# Patient Record
Sex: Male | Born: 1970 | Race: White | Hispanic: No | Marital: Married | Smoking: Never smoker
Health system: Southern US, Community
[De-identification: ages and names within clinical notes are randomized; demographics above are authoritative.]

## PROBLEM LIST (undated history)

## (undated) DIAGNOSIS — IMO0002 Reserved for concepts with insufficient information to code with codable children: Secondary | ICD-10-CM

## (undated) DIAGNOSIS — E0590 Thyrotoxicosis, unspecified without thyrotoxic crisis or storm: Secondary | ICD-10-CM

## (undated) HISTORY — DX: Thyrotoxicosis, unspecified without thyrotoxic crisis or storm: E05.90

## (undated) HISTORY — DX: Reserved for concepts with insufficient information to code with codable children: IMO0002

---

## 2005-07-13 ENCOUNTER — Ambulatory Visit: Payer: Self-pay | Admitting: Endocrinology

## 2006-06-04 ENCOUNTER — Ambulatory Visit: Payer: Self-pay | Admitting: Internal Medicine

## 2006-06-27 ENCOUNTER — Ambulatory Visit: Payer: Self-pay

## 2006-06-27 ENCOUNTER — Encounter: Payer: Self-pay | Admitting: Cardiology

## 2008-01-01 HISTORY — PX: VASECTOMY: SHX75

## 2008-02-18 ENCOUNTER — Ambulatory Visit: Payer: Self-pay | Admitting: Endocrinology

## 2008-02-19 LAB — CONVERTED CEMR LAB
ALT: 38 units/L — ABNORMAL HIGH (ref 0–35)
Alkaline Phosphatase: 91 units/L (ref 39–117)
BUN: 16 mg/dL (ref 6–23)
Basophils Absolute: 0 10*3/uL (ref 0.0–0.1)
Basophils Relative: 0.1 % (ref 0.0–1.0)
Bilirubin, Direct: 0.3 mg/dL (ref 0.0–0.3)
Calcium: 10 mg/dL (ref 8.4–10.5)
Eosinophils Absolute: 0.1 10*3/uL (ref 0.0–0.6)
GFR calc Af Amer: 60 mL/min
GFR calc non Af Amer: 49 mL/min
Lymphocytes Relative: 29.3 % (ref 12.0–46.0)
MCHC: 33.4 g/dL (ref 30.0–36.0)
MCV: 87.1 fL (ref 78.0–100.0)
Monocytes Relative: 12.5 % — ABNORMAL HIGH (ref 3.0–11.0)
Neutro Abs: 2.6 10*3/uL (ref 1.4–7.7)
Platelets: 194 10*3/uL (ref 150–400)
Potassium: 4.3 meq/L (ref 3.5–5.1)
RBC / HPF: NONE SEEN
Specific Gravity, Urine: >=1.03 (ref 1.000–1.03)
TSH: 0.04 microintl units/mL — ABNORMAL LOW (ref 0.35–5.50)
Total Bilirubin: 1.8 mg/dL — ABNORMAL HIGH (ref 0.3–1.2)
Total CHOL/HDL Ratio: 2.3
Total Protein: 6.5 g/dL (ref 6.0–8.3)
Triglycerides: 41 mg/dL (ref 0–149)
Urine Glucose: NEGATIVE mg/dL
VLDL: 8 mg/dL (ref 0–40)

## 2008-02-23 ENCOUNTER — Ambulatory Visit: Payer: Self-pay | Admitting: Endocrinology

## 2008-02-23 DIAGNOSIS — E0590 Thyrotoxicosis, unspecified without thyrotoxic crisis or storm: Secondary | ICD-10-CM | POA: Insufficient documentation

## 2008-02-23 HISTORY — DX: Thyrotoxicosis, unspecified without thyrotoxic crisis or storm: E05.90

## 2008-03-02 ENCOUNTER — Encounter: Admission: RE | Admit: 2008-03-02 | Discharge: 2008-03-02 | Payer: Self-pay | Admitting: Endocrinology

## 2008-03-08 ENCOUNTER — Ambulatory Visit: Payer: Self-pay | Admitting: Endocrinology

## 2008-03-24 ENCOUNTER — Telehealth (INDEPENDENT_AMBULATORY_CARE_PROVIDER_SITE_OTHER): Payer: Self-pay | Admitting: *Deleted

## 2008-04-26 ENCOUNTER — Ambulatory Visit: Payer: Self-pay | Admitting: Endocrinology

## 2008-04-26 DIAGNOSIS — IMO0002 Reserved for concepts with insufficient information to code with codable children: Secondary | ICD-10-CM

## 2008-04-26 HISTORY — DX: Reserved for concepts with insufficient information to code with codable children: IMO0002

## 2008-06-07 ENCOUNTER — Ambulatory Visit: Payer: Self-pay | Admitting: Endocrinology

## 2008-06-07 LAB — CONVERTED CEMR LAB: TSH: 2.22 microintl units/mL (ref 0.35–5.50)

## 2008-09-14 ENCOUNTER — Ambulatory Visit: Payer: Self-pay | Admitting: Endocrinology

## 2008-09-14 DIAGNOSIS — L723 Sebaceous cyst: Secondary | ICD-10-CM | POA: Insufficient documentation

## 2008-09-20 ENCOUNTER — Telehealth (INDEPENDENT_AMBULATORY_CARE_PROVIDER_SITE_OTHER): Payer: Self-pay | Admitting: *Deleted

## 2008-09-22 ENCOUNTER — Encounter: Payer: Self-pay | Admitting: Endocrinology

## 2008-12-15 ENCOUNTER — Ambulatory Visit: Payer: Self-pay | Admitting: Endocrinology

## 2009-02-10 ENCOUNTER — Ambulatory Visit: Payer: Self-pay | Admitting: Endocrinology

## 2009-02-10 LAB — CONVERTED CEMR LAB
Basophils Relative: 0.2 % (ref 0.0–3.0)
Eosinophils Absolute: 0.1 10*3/uL (ref 0.0–0.7)
Eosinophils Relative: 1.1 % (ref 0.0–5.0)
Hemoglobin: 16.2 g/dL (ref 13.0–17.0)
MCV: 91.7 fL (ref 78.0–100.0)
Neutrophils Relative %: 59.8 % (ref 43.0–77.0)
RBC: 5.33 M/uL (ref 4.22–5.81)
WBC: 5.2 10*3/uL (ref 4.5–10.5)

## 2009-07-15 ENCOUNTER — Ambulatory Visit: Payer: Self-pay | Admitting: Internal Medicine

## 2009-07-15 DIAGNOSIS — R079 Chest pain, unspecified: Secondary | ICD-10-CM | POA: Insufficient documentation

## 2009-07-15 DIAGNOSIS — M549 Dorsalgia, unspecified: Secondary | ICD-10-CM | POA: Insufficient documentation

## 2009-10-05 ENCOUNTER — Ambulatory Visit: Payer: Self-pay | Admitting: Endocrinology

## 2010-03-24 ENCOUNTER — Ambulatory Visit: Payer: Self-pay | Admitting: Endocrinology

## 2010-03-24 DIAGNOSIS — R319 Hematuria, unspecified: Secondary | ICD-10-CM | POA: Insufficient documentation

## 2010-03-24 DIAGNOSIS — R310 Gross hematuria: Secondary | ICD-10-CM | POA: Insufficient documentation

## 2010-03-24 LAB — CONVERTED CEMR LAB
Basophils Relative: 0.5 % (ref 0.0–3.0)
Bilirubin Urine: NEGATIVE
Glucose, Urine, Semiquant: NEGATIVE
Hemoglobin: 14.7 g/dL (ref 13.0–17.0)
Lymphocytes Relative: 26.3 % (ref 12.0–46.0)
Monocytes Relative: 7.2 % (ref 3.0–12.0)
Neutro Abs: 4.2 10*3/uL (ref 1.4–7.7)
RBC: 4.76 M/uL (ref 4.22–5.81)
TSH: 0.59 microintl units/mL (ref 0.35–5.50)
WBC Urine, dipstick: NEGATIVE
pH: 5

## 2010-04-04 ENCOUNTER — Encounter: Payer: Self-pay | Admitting: Endocrinology

## 2010-04-13 ENCOUNTER — Encounter: Payer: Self-pay | Admitting: Endocrinology

## 2010-06-16 ENCOUNTER — Telehealth: Payer: Self-pay | Admitting: Endocrinology

## 2011-01-30 NOTE — Progress Notes (Signed)
Summary: tapazole  Phone Note Refill Request Message from:  Fax from Pharmacy on June 16, 2010 8:46 AM  Refills Requested: Medication #1:  TAPAZOLE 5 MG TABS TAKE 1 by mouth three times a WEEK (M   Last Refilled: 02/08/2010 Initial call taken by: Orlan Leavens,  June 16, 2010 8:47 AM    Prescriptions: TAPAZOLE 5 MG TABS (METHIMAZOLE) TAKE 1 by mouth three times a WEEK (M,W,F)  #50 x 3   Entered by:   Orlan Leavens   Authorized by:   Minus Breeding MD   Signed by:   Orlan Leavens on 06/16/2010   Method used:   Electronically to        [facility] [phone]* (retail)       [address]       [place], [place]  [address]       Ph: [phone]       Fax: [phone]   RxID:   [id number]

## 2011-01-30 NOTE — Letter (Signed)
[facility]: [facility]   Imported By: Sherian Rein 04/27/2010 10:28:00  _____________________________________________________________________  External Attachment:    Type:   Image     Comment:   External Document

## 2011-01-30 NOTE — Assessment & Plan Note (Signed)
Summary: blood in urine/cd   Vital Signs:  Patient profile:   41 year old male Height:      71 inches (180.34 cm) Weight:      228 pounds (103.64 kg) O2 Sat:      98 % on Room air Temp:     96.9 degrees F (36.06 degrees C) oral Pulse rate:   58 / minute BP sitting:   118 / 68  (left arm) Cuff size:   regular  Vitals Entered By: Josph Macho RMA (March 24, 2010 10:54 AM) Taken by Sydell Axon  O2 Flow:  Room air CC: pt states there is blood in his urine.//[place]   CC:  pt states there is blood in his urine.//[place].  History of Present Illness: yesterday, pt noted slight amount bright red blood from the urethra, in the context of running 6 miles.  he had urinary frequency x a few hrs.  it recurred this am.  there is little or no associated pain.   he atakes and tolerates the tapazole well  Current Medications (verified): 1)  Tapazole 5 Mg Tabs (Methimazole) .... Take 1 By Mouth Three Times A Week (M,w,f)  Allergies (verified): 1)  ! 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         no bleeding elsewhere in the body  Physical Exam  General:  normal appearance.   Neck:  Supple without thyroid enlargement or tenderness.  Genitalia:  Normal external male genitalia with no urethral discharge. no blood is seen. Additional Exam:  FastTSH                   0.59 uIU/mL                 0.35-5.50    White Cell Count          6.5 K/uL                    4.5-10.5     Hemoglobin                14.7 g/dL                   16.1-09.6   Hematocrit                44.5 %                      39.0-52.0    Platelet Count            191.0 K/uL    Impression & Recommendations:  Problem # 1:  HYPERTHYROIDISM (ICD-242.90) well-controlled  Problem # 2:  GROSS  HEMATURIA (ICD-599.71) Assessment: New prob due to running  Other Orders: Urology Referral (Urology) TLB-TSH (Thyroid Stimulating Hormone) (84443-TSH) TLB-CBC Platelet - w/Differential (85025-CBCD) Est. Patient Level IV (04540)  Patient Instructions: 1)  tests are being ordered for you today.  a few days after the test(s), please call [phone] to hear your test results. 2)  pending the test results, please continue the same medications for now 3)  refer urology. you will be called with a day and time for an appointment 4)  return 6 months. 5)  (update: i left message on phone-tree:  same methimazole)  Laboratory Results   Urine Tests  Routine Urinalysis   Color: yellow Appearance: Clear Glucose: negative   (Normal Range: Negative) Bilirubin: negative   (Normal Range: Negative) Ketone: trace (5)   (Normal Range: Negative) Spec. Gravity: 1.020   (Normal Range: 1.003-1.035) Blood: large   (Normal Range: Negative) pH: 5.0   (Normal Range: 5.0-8.0) Protein: trace   (Normal Range: Negative) Urobilinogen: 0.2   (Normal Range: 0-1) Nitrite: negative   (Normal Range: Negative) Leukocyte Esterace: negative   (Normal Range: Negative)

## 2011-01-30 NOTE — Consult Note (Signed)
Summary: [facility]   Imported By: Lanelle Bal 04/18/2010 10:06:07  _____________________________________________________________________  External Attachment:    Type:   Image     Comment:   External Document

## 2011-06-15 ENCOUNTER — Other Ambulatory Visit: Payer: Self-pay | Admitting: Endocrinology

## 2011-07-19 ENCOUNTER — Encounter: Payer: Self-pay | Admitting: *Deleted

## 2011-07-19 NOTE — Telephone Encounter (Signed)
A user error has taken place: encounter opened in error, closed for administrative reasons.

## 2011-07-23 ENCOUNTER — Other Ambulatory Visit: Payer: Self-pay

## 2011-07-23 MED ORDER — METHIMAZOLE 5 MG PO TABS: ORAL_TABLET | ORAL | Status: DC

## 2011-08-02 ENCOUNTER — Ambulatory Visit (INDEPENDENT_AMBULATORY_CARE_PROVIDER_SITE_OTHER): Payer: BC Managed Care – PPO | Admitting: Endocrinology

## 2011-08-02 ENCOUNTER — Other Ambulatory Visit (INDEPENDENT_AMBULATORY_CARE_PROVIDER_SITE_OTHER): Payer: BC Managed Care – PPO

## 2011-08-02 ENCOUNTER — Encounter: Payer: Self-pay | Admitting: Endocrinology

## 2011-08-02 VITALS — BP 118/80 | HR 64 | Temp 98.0°F | Ht 73.0 in | Wt 220.0 lb

## 2011-08-02 DIAGNOSIS — E0590 Thyrotoxicosis, unspecified without thyrotoxic crisis or storm: Secondary | ICD-10-CM

## 2011-08-02 MED ORDER — CYCLOBENZAPRINE HCL 10 MG PO TABS: 10.0000 mg | ORAL_TABLET | Freq: Three times a day (TID) | ORAL | Status: AC | PRN

## 2011-08-02 NOTE — Progress Notes (Signed)
  Subjective:    Patient ID: Harry Mathews, male    DOB: 06/21/1970, 41 y.o.   MRN: [id number]  HPI pt states he feels well in general.  He takes tapazole as rx'ed.   He continues to have 2 years of slight "spasms" at the right upper back, in the context of running.  No assoc numbness. Past Medical History  Diagnosis Date  . HYPERTHYROIDISM 02/23/2008  . UNSPECIFIED DISORDER OF URETHRA&URINARY TRACT 04/26/2008    Past Surgical History  Procedure Date  . Vasectomy 2009    History   Social History  . Marital Status: Married    Spouse Name: N/A    Number of Children: 3  . Years of Education: N/A   Occupational History  . Construction    Social History Main Topics  . Smoking status: Never Smoker   . Smokeless tobacco: Not on file  . Alcohol Use: Yes     social  . Drug Use:   . Sexually Active:    Other Topics Concern  . Not on file   Social History Narrative  . No narrative on file    Current Outpatient Prescriptions on File Prior to Visit  Medication Sig Dispense Refill  . methimazole (TAPAZOLE) 5 MG tablet Take on tablet by mouth three times weekly (Monday, Wednesday and Friday)  50 tablet  0    Allergies  Allergen Reactions  . Penicillins     Family History  Problem Relation Age of Onset  . COPD Neg Hx     BP 118/80  Pulse 64  Temp(Src) 98 F (36.7 C) (Oral)  Ht 6\' 1"  (1.854 m)  Wt 220 lb (99.791 kg)  BMI 29.03 kg/m2  SpO2 97%  Review of Systems Denies fever.  He has lost a few lbs.    Objective:   Physical Exam GENERAL: no distress Neck:  No goiter or nodule.    Lab Results  Component Value Date   TSH 0.03* 08/03/2011   Assessment & Plan:  Hyperthyroidism, needs increased rx Back sxs, possibly thyroid-related

## 2011-08-02 NOTE — Patient Instructions (Addendum)
blood tests are being ordered for you today.  please call [phone] to hear your test results.  You will be prompted to enter the 9-digit "MRN" number that appears at the top left of this page, followed by #.  Then you will hear the message. Please make a regular physical appointment in 6 months. (update: i left message on phone-tree:  Increase to 5 mg qd.  Go to lab in 3 mos for tsh and free t4.  if ever you have fever while taking this medication, stop it and call us, because of the risk of a rare side-effect)

## 2011-08-03 ENCOUNTER — Other Ambulatory Visit (INDEPENDENT_AMBULATORY_CARE_PROVIDER_SITE_OTHER): Payer: BC Managed Care – PPO

## 2011-08-03 DIAGNOSIS — E0590 Thyrotoxicosis, unspecified without thyrotoxic crisis or storm: Secondary | ICD-10-CM

## 2011-08-03 MED ORDER — METHIMAZOLE 5 MG PO TABS: 5.0000 mg | ORAL_TABLET | Freq: Every day | ORAL | Status: DC

## 2011-09-12 ENCOUNTER — Ambulatory Visit (INDEPENDENT_AMBULATORY_CARE_PROVIDER_SITE_OTHER): Payer: BC Managed Care – PPO | Admitting: Endocrinology

## 2011-09-12 ENCOUNTER — Encounter: Payer: Self-pay | Admitting: Endocrinology

## 2011-09-12 ENCOUNTER — Other Ambulatory Visit (INDEPENDENT_AMBULATORY_CARE_PROVIDER_SITE_OTHER): Payer: BC Managed Care – PPO

## 2011-09-12 ENCOUNTER — Ambulatory Visit (INDEPENDENT_AMBULATORY_CARE_PROVIDER_SITE_OTHER)
Admission: RE | Admit: 2011-09-12 | Discharge: 2011-09-12 | Disposition: A | Discharge status: Home / Self Care | Payer: BC Managed Care – PPO | Source: Ambulatory Visit | Attending: Endocrinology | Admitting: Endocrinology

## 2011-09-12 DIAGNOSIS — R05 Cough: Secondary | ICD-10-CM

## 2011-09-12 DIAGNOSIS — Z79899 Other long term (current) drug therapy: Secondary | ICD-10-CM

## 2011-09-12 DIAGNOSIS — E0590 Thyrotoxicosis, unspecified without thyrotoxic crisis or storm: Secondary | ICD-10-CM

## 2011-09-12 DIAGNOSIS — R059 Cough, unspecified: Secondary | ICD-10-CM

## 2011-09-12 DIAGNOSIS — R229 Localized swelling, mass and lump, unspecified: Secondary | ICD-10-CM

## 2011-09-12 LAB — CBC WITH DIFFERENTIAL/PLATELET
Basophils Relative: 0.1 % (ref 0.0–3.0)
Eosinophils Relative: 1.2 % (ref 0.0–5.0)
HCT: 51.1 % (ref 39.0–52.0)
Lymphs Abs: 1.1 10*3/uL (ref 0.7–4.0)
MCV: 91.4 fl (ref 78.0–100.0)
Monocytes Absolute: 0.6 10*3/uL (ref 0.1–1.0)
Neutrophils Relative %: 81.3 % — ABNORMAL HIGH (ref 43.0–77.0)
RBC: 5.59 Mil/uL (ref 4.22–5.81)
WBC: 9.7 10*3/uL (ref 4.5–10.5)

## 2011-09-12 LAB — T4, FREE: Free T4: 1.28 ng/dL (ref 0.60–1.60)

## 2011-09-12 MED ORDER — DOXYCYCLINE HYCLATE 100 MG PO TABS: 100.0000 mg | ORAL_TABLET | Freq: Two times a day (BID) | ORAL | Status: AC

## 2011-09-12 MED ORDER — METHIMAZOLE 10 MG PO TABS: 10.0000 mg | ORAL_TABLET | Freq: Three times a day (TID) | ORAL | Status: DC

## 2011-09-12 MED ORDER — PROMETHAZINE-CODEINE 6.25-10 MG/5ML PO SYRP: 5.0000 mL | ORAL_SOLUTION | ORAL | Status: AC | PRN

## 2011-09-12 NOTE — Patient Instructions (Addendum)
blood tests, and a chest-x-ray. are being requested for you today.  please call [phone] to hear your test results.  You will be prompted to enter the 9-digit "MRN" number that appears at the top left of this page, followed by #.  Then you will hear the message. here is a sample of "advair-100."  take 1 puff 2x a day.  rinse mouth after using. i have sent a prescription to your pharmacy for an antibiotic. Here is a presciption for cough syrup.   I hope you feel better soon.  If you don't feel better by next week, please call back. Refer to a dermatologist.  you will receive a phone call, about a day and time for an appointment (update: i left message on phone-tree:  Increase tapazole to 10/d.  Ret 2 mos)

## 2011-09-12 NOTE — Progress Notes (Signed)
  Subjective:    Patient ID: Harry Mathews, male    DOB: [birth year], 41 y.o.   MRN: [id number]  HPI Pt states 10 days of moderate dry-quality cough, and assoc pain at the chest.  The cough has become productive within the last day.  Denies sore throat and earache. He feels no different since the tapazole was increased. Pt states few years of moderate # of skin nodules on the hands (left >> right), but no assoc pain or itching. Past Medical History  Diagnosis Date  . HYPERTHYROIDISM 02/23/2008  . UNSPECIFIED DISORDER OF URETHRA&URINARY TRACT 04/26/2008    Past Surgical History  Procedure Date  . Vasectomy 2009    History   Social History  . Marital Status: Married    Spouse Name: N/A    Number of Children: 3  . Years of Education: N/A   Occupational History  . Construction    Social History Main Topics  . Smoking status: Never Smoker   . Smokeless tobacco: Not on file  . Alcohol Use: Yes     social  . Drug Use:   . Sexually Active:    Other Topics Concern  . Not on file   Social History Narrative  . No narrative on file    No current outpatient prescriptions on file prior to visit.    Allergies  Allergen Reactions  . Penicillins     Family History  Problem Relation Age of Onset  . COPD Neg Hx     BP 124/76  Pulse 73  Temp(Src) 97.9 F (36.6 C) (Oral)  Ht 6\' 1"  (1.854 m)  Wt 218 lb (98.884 kg)  BMI 28.76 kg/m2  SpO2 98%    Review of Systems Denies fever, but he has diaphoresis.    Objective:   Physical Exam VITAL SIGNS:  See vs page GENERAL: no distress head: no deformity eyes: no periorbital swelling, no proptosis external nose and ears are normal mouth: no lesion seen Ears: left tm is red, but right is normal LUNGS:  Clear to auscultation, except for a few exp wheezes. Skin: multiple flat nodules on the extensor aspect of the hands, more on the left.    Assessment & Plan:  Skin nodules, new.  uncertain etiology [place]ford Peers,  new Hyperthyroidism, needs increased rx

## 2011-09-13 ENCOUNTER — Ambulatory Visit: Payer: BC Managed Care – PPO | Admitting: Endocrinology

## 2011-10-08 ENCOUNTER — Telehealth: Payer: Self-pay | Admitting: *Deleted

## 2011-10-08 DIAGNOSIS — R229 Localized swelling, mass and lump, unspecified: Secondary | ICD-10-CM

## 2011-10-08 NOTE — Telephone Encounter (Signed)
done

## 2011-10-08 NOTE — Telephone Encounter (Signed)
Pt is requesting a referral to another dermatologist (does not want to see Dr. Sharyn Lull).

## 2011-10-09 NOTE — Telephone Encounter (Signed)
Pt informed via VM, left message for pt to callback office with any questions/concerns.  

## 2012-02-20 ENCOUNTER — Encounter: Payer: Self-pay | Admitting: Endocrinology

## 2012-02-20 ENCOUNTER — Ambulatory Visit (INDEPENDENT_AMBULATORY_CARE_PROVIDER_SITE_OTHER): Payer: BC Managed Care – PPO | Admitting: Endocrinology

## 2012-02-20 ENCOUNTER — Other Ambulatory Visit (INDEPENDENT_AMBULATORY_CARE_PROVIDER_SITE_OTHER): Payer: BC Managed Care – PPO

## 2012-02-20 DIAGNOSIS — Z79899 Other long term (current) drug therapy: Secondary | ICD-10-CM

## 2012-02-20 DIAGNOSIS — Z0000 Encounter for general adult medical examination without abnormal findings: Secondary | ICD-10-CM

## 2012-02-20 DIAGNOSIS — E0590 Thyrotoxicosis, unspecified without thyrotoxic crisis or storm: Secondary | ICD-10-CM

## 2012-02-20 LAB — LIPID PANEL
Cholesterol: 110 mg/dL (ref 0–200)
HDL: 48.4 mg/dL (ref >39.00)
LDL Cholesterol: 56 mg/dL (ref 0–99)
Total CHOL/HDL Ratio: 2
Triglycerides: 30 mg/dL (ref 0.0–149.0)
VLDL: 6 mg/dL (ref 0.0–40.0)

## 2012-02-20 LAB — BASIC METABOLIC PANEL
CO2: 26 mEq/L (ref 19–32)
Chloride: 106 mEq/L (ref 96–112)
Potassium: 4.1 mEq/L (ref 3.5–5.1)
Sodium: 141 mEq/L (ref 135–145)

## 2012-02-20 LAB — URINALYSIS, ROUTINE W REFLEX MICROSCOPIC
Bilirubin Urine: NEGATIVE
Specific Gravity, Urine: >=1.03 (ref 1.000–1.030)
Urine Glucose: NEGATIVE
Urobilinogen, UA: 1 (ref 0.0–1.0)

## 2012-02-20 LAB — CBC WITH DIFFERENTIAL/PLATELET
Basophils Absolute: 0 10*3/uL (ref 0.0–0.1)
Eosinophils Relative: 3.4 % (ref 0.0–5.0)
Hemoglobin: 15.9 g/dL (ref 13.0–17.0)
Lymphocytes Relative: 23.6 % (ref 12.0–46.0)
Monocytes Relative: 11.8 % (ref 3.0–12.0)
Neutro Abs: 4.5 10*3/uL (ref 1.4–7.7)
RDW: 12.8 % (ref 11.5–14.6)
WBC: 7.5 10*3/uL (ref 4.5–10.5)

## 2012-02-20 LAB — T4, FREE: Free T4: 0.97 ng/dL (ref 0.60–1.60)

## 2012-02-20 LAB — HEPATIC FUNCTION PANEL
AST: 34 U/L (ref 0–37)
Albumin: 4.4 g/dL (ref 3.5–5.2)
Alkaline Phosphatase: 97 U/L (ref 39–117)
Total Bilirubin: 1.2 mg/dL (ref 0.3–1.2)

## 2012-02-20 MED ORDER — METHIMAZOLE 10 MG PO TABS: 10.0000 mg | ORAL_TABLET | Freq: Every day | ORAL | Status: DC

## 2012-02-20 NOTE — Progress Notes (Signed)
Subjective:    Patient ID: Harry Mathews, male    DOB: 21-Mar-1970, 42 y.o.   MRN: [id number]  HPI here for regular wellness examination.  He's feeling pretty well in general, and says chronic med probs are stable, except as noted below.   Past Medical History  Diagnosis Date  . HYPERTHYROIDISM 02/23/2008  . UNSPECIFIED DISORDER OF URETHRA&URINARY TRACT 04/26/2008    Past Surgical History  Procedure Date  . Vasectomy 2009    History   Social History  . Marital Status: Married    Spouse Name: N/A    Number of Children: 3  . Years of Education: N/A   Occupational History  . Construction    Social History Main Topics  . Smoking status: Never Smoker   . Smokeless tobacco: Not on file  . Alcohol Use: Yes     social  . Drug Use:   . Sexually Active:    Other Topics Concern  . Not on file   Social History Narrative  . No narrative on file    No current outpatient prescriptions on file prior to visit.    Allergies  Allergen Reactions  . Penicillins     Family History  Problem Relation Age of Onset  . COPD Neg Hx     BP 130/90  Pulse 62  Temp(Src) 96.9 F (36.1 C) (Oral)  Ht 6\' 1"  (1.854 m)  Wt 224 lb 4.8 oz (101.742 kg)  BMI 29.59 kg/m2  SpO2 95%    Review of Systems  Constitutional: Negative for unexpected weight change.  HENT: Negative for tinnitus.   Eyes: Negative for visual disturbance.  Respiratory: Negative for shortness of breath.   Cardiovascular: Negative for chest pain.  Gastrointestinal: Negative for anal bleeding.  Genitourinary: Negative for hematuria and difficulty urinating.  Musculoskeletal: Negative for back pain.  Skin: Negative for rash.  Neurological: Negative for numbness and headaches.  Hematological: Does not bruise/bleed easily.  Psychiatric/Behavioral: Negative for dysphoric mood.       Objective:   Physical Exam VS: see vs page GEN: no distress HEAD: head: no deformity eyes: no periorbital swelling, no  proptosis external nose and ears are normal mouth: no lesion seen NECK: supple, thyroid is not enlarged CHEST WALL: no deformity LUNGS: clear to auscultation BREASTS:  pseudogynecomastia CV: reg rate and rhythm, no murmur ABD: abdomen is soft, nontender.  no hepatosplenomegaly.  not distended.  no hernia GENITALIA:  Normal male.   MUSCULOSKELETAL: muscle bulk and strength are grossly normal.  no obvious joint swelling.  gait is normal and steady EXTEMITIES: no deformity.  no ulcer on the feet.  feet are of normal color and temp.  no edema PULSES: dorsalis pedis intact bilat.  no carotid bruit NEURO:  cn 2-12 grossly intact.   readily moves all 4's.  sensation is intact to touch on the feet SKIN:  Normal texture and temperature.  No rash or suspicious lesion is visible.  Ecchymosis at RLQ of abdomen is from recent mild basketball injury NODES:  None palpable at the neck PSYCH: alert, oriented x3.  Does not appear anxious nor depressed.       Assessment & Plan:  Wellness visit today, with problems stable, except as noted.   SEPARATE EVALUATION FOLLOWS--EACH PROBLEM HERE IS NEW, NOT RESPONDING TO TREATMENT, OR POSES SIGNIFICANT RISK TO THE PATIENT'S HEALTH: HISTORY OF THE PRESENT ILLNESS: Pt states few years of slight healing loss from both ears, but no assoc otalgia. He takes tapazole only 5  mg qd.   PAST MEDICAL HISTORY reviewed and up to date today REVIEW OF SYSTEMS: Denies fever and syncope PHYSICAL EXAMINATION: VITAL SIGNS:  See vs page GENERAL: no distress Both eac's and tm's are normal. LAB/XRAY RESULTS: Lab Results  Component Value Date   TSH 0.11* 02/20/2012  IMPRESSION: Hearing loss, new Hyperthyroidism, needs increased rx PLAN: See instruction page

## 2012-02-20 NOTE — Patient Instructions (Addendum)
please consider these measures for your health:  minimize alcohol.  do not use tobacco products.  keep firearms safely stored.  always use seat belts.  have working smoke alarms in your home.  see an eye doctor and dentist regularly.  never drive under the influence of alcohol or drugs (including prescription drugs).  those with fair skin should take precautions against the sun. blood tests are being requested for you today.  please call [phone] to hear your test results.  You will be prompted to enter the 9-digit "MRN" number that appears at the top left of this page, followed by #.  Then you will hear the message. Please come back for a follow-up appointment in 6 months. Call if you want to see a hearing specialist.   (update: i left message on phone-tree:  Increase tapazole to 10/d)

## 2012-11-17 ENCOUNTER — Encounter: Payer: Self-pay | Admitting: Endocrinology

## 2012-11-17 ENCOUNTER — Ambulatory Visit (INDEPENDENT_AMBULATORY_CARE_PROVIDER_SITE_OTHER): Payer: BC Managed Care – PPO | Admitting: Endocrinology

## 2012-11-17 VITALS — BP 130/78 | HR 71 | Temp 97.8°F | Wt 228.0 lb

## 2012-11-17 DIAGNOSIS — E0590 Thyrotoxicosis, unspecified without thyrotoxic crisis or storm: Secondary | ICD-10-CM

## 2012-11-17 DIAGNOSIS — Z23 Encounter for immunization: Secondary | ICD-10-CM

## 2012-11-17 MED ORDER — PROMETHAZINE-CODEINE 6.25-10 MG/5ML PO SYRP: 5.0000 mL | ORAL_SOLUTION | ORAL | Status: AC | PRN

## 2012-11-17 MED ORDER — AZITHROMYCIN 500 MG PO TABS: 500.0000 mg | ORAL_TABLET | Freq: Every day | ORAL | Status: DC

## 2012-11-17 MED ORDER — ALBUTEROL SULFATE HFA 108 (90 BASE) MCG/ACT IN AERS: 2.0000 | INHALATION_SPRAY | Freq: Four times a day (QID) | RESPIRATORY_TRACT | Status: DC | PRN

## 2012-11-17 NOTE — Patient Instructions (Addendum)
Here are 3 prescriptions: antibiotic, cough syrup, and inhaler.  A thyroid blood test is being requested for you today.  We'll contact you with results.   I hope you feel better soon.  If you don't feel better by next week, please call back.  Please call sooner if you get worse.

## 2012-11-17 NOTE — Progress Notes (Signed)
  Subjective:    Patient ID: Harry Mathews, male    DOB: Mar 26, 1970, 42 y.o.   MRN: [id number]  HPI Pt states 2 weeks of slight prod-quality cough in the chest, and assoc wheezing. 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   Past Medical History  Diagnosis Date  . HYPERTHYROIDISM 02/23/2008  . UNSPECIFIED DISORDER OF URETHRA&URINARY TRACT 04/26/2008    Past Surgical History  Procedure Date  . Vasectomy 2009    History   Social History  . Marital Status: Married    Spouse Name: N/A    Number of Children: 3  . Years of Education: N/A   Occupational History  . Construction    Social History Main Topics  . Smoking status: Never Smoker   . Smokeless tobacco: Not on file  . Alcohol Use: Yes     Comment: social  . Drug Use:   . Sexually Active:    Other Topics Concern  . Not on file   Social History Narrative  . No narrative on file    Current Outpatient Prescriptions on File Prior to Visit  Medication Sig Dispense Refill  . methimazole (TAPAZOLE) 10 MG tablet Take 1 tablet (10 mg total) by mouth daily.  30 tablet  7  . albuterol (PROVENTIL HFA;VENTOLIN HFA) 108 (90 BASE) MCG/ACT inhaler Inhale 2 puffs into the lungs every 6 (six) hours as needed for wheezing.  1 Inhaler  0    Allergies  Allergen Reactions  . Penicillins     Family History  Problem Relation Age of Onset  . COPD Neg Hx     BP 130/78  Pulse 71  Temp 97.8 F (36.6 C) (Oral)  Wt 228 lb (103.42 kg)  SpO2 96%   Review of Systems Denies fever and earache    Objective:   Physical Exam VITAL SIGNS:  See vs page GENERAL: no distress head: no deformity eyes: no periorbital swelling, no proptosis external nose and ears are normal mouth: no lesion seen LUNGS:  Clear to auscultation, except for a few wheezes.    Lab Results  Component Value Date   TSH 2.050 11/17/2012      Assessment & Plan:    URI, new Hyperthyroidism, well-controlled

## 2013-08-28 ENCOUNTER — Other Ambulatory Visit: Payer: Self-pay | Admitting: Endocrinology

## 2013-11-09 ENCOUNTER — Ambulatory Visit (INDEPENDENT_AMBULATORY_CARE_PROVIDER_SITE_OTHER): Payer: BC Managed Care – PPO

## 2013-11-09 DIAGNOSIS — Z23 Encounter for immunization: Secondary | ICD-10-CM

## 2013-12-22 ENCOUNTER — Other Ambulatory Visit: Payer: Self-pay | Admitting: Endocrinology

## 2014-06-10 ENCOUNTER — Other Ambulatory Visit: Payer: Self-pay | Admitting: Endocrinology

## 2014-06-11 NOTE — Telephone Encounter (Signed)
Refill x 1 Ov is due 

## 2014-09-10 ENCOUNTER — Encounter: Payer: Self-pay | Admitting: Endocrinology

## 2014-09-10 ENCOUNTER — Ambulatory Visit (INDEPENDENT_AMBULATORY_CARE_PROVIDER_SITE_OTHER): Payer: BC Managed Care – PPO | Admitting: Endocrinology

## 2014-09-10 VITALS — BP 118/70 | HR 85 | Temp 97.6°F | Ht 73.0 in | Wt 225.0 lb

## 2014-09-10 DIAGNOSIS — E0590 Thyrotoxicosis, unspecified without thyrotoxic crisis or storm: Secondary | ICD-10-CM | POA: Diagnosis not present

## 2014-09-10 DIAGNOSIS — Z79899 Other long term (current) drug therapy: Secondary | ICD-10-CM

## 2014-09-10 DIAGNOSIS — R319 Hematuria, unspecified: Secondary | ICD-10-CM | POA: Diagnosis not present

## 2014-09-10 DIAGNOSIS — IMO0002 Reserved for concepts with insufficient information to code with codable children: Secondary | ICD-10-CM

## 2014-09-10 DIAGNOSIS — Z0000 Encounter for general adult medical examination without abnormal findings: Secondary | ICD-10-CM

## 2014-09-10 DIAGNOSIS — Z23 Encounter for immunization: Secondary | ICD-10-CM

## 2014-09-10 LAB — URINALYSIS, ROUTINE W REFLEX MICROSCOPIC
Bilirubin Urine: NEGATIVE
LEUKOCYTES UA: NEGATIVE
NITRITE: NEGATIVE
SPECIFIC GRAVITY, URINE: 1.025 (ref 1.000–1.030)
TOTAL PROTEIN, URINE-UPE24: NEGATIVE
Urine Glucose: NEGATIVE
Urobilinogen, UA: 0.2 (ref 0.0–1.0)
WBC, UA: NONE SEEN (ref 0–?)
pH: 5.5 (ref 5.0–8.0)

## 2014-09-10 LAB — LIPID PANEL
CHOL/HDL RATIO: 3
Cholesterol: 90 mg/dL (ref 0–200)
HDL: 34.3 mg/dL — ABNORMAL LOW (ref >39.00)
LDL CALC: 48 mg/dL (ref 0–99)
NonHDL: 55.7
Triglycerides: 38 mg/dL (ref 0.0–149.0)
VLDL: 7.6 mg/dL (ref 0.0–40.0)

## 2014-09-10 LAB — HEPATIC FUNCTION PANEL
ALBUMIN: 3.8 g/dL (ref 3.5–5.2)
ALT: 26 U/L (ref 0–53)
AST: 26 U/L (ref 0–37)
Alkaline Phosphatase: 69 U/L (ref 39–117)
BILIRUBIN TOTAL: 0.7 mg/dL (ref 0.2–1.2)
Bilirubin, Direct: 0.2 mg/dL (ref 0.0–0.3)
Total Protein: 7.1 g/dL (ref 6.0–8.3)

## 2014-09-10 LAB — TSH: TSH: 1.61 u[IU]/mL (ref 0.35–4.50)

## 2014-09-10 LAB — BASIC METABOLIC PANEL
BUN: 18 mg/dL (ref 6–23)
CO2: 25 mEq/L (ref 19–32)
Calcium: 9 mg/dL (ref 8.4–10.5)
Chloride: 107 mEq/L (ref 96–112)
Creatinine, Ser: 1.2 mg/dL (ref 0.4–1.5)
GFR: 71.04 mL/min (ref >60.00)
Glucose, Bld: 85 mg/dL (ref 70–99)
Potassium: 3.7 mEq/L (ref 3.5–5.1)
SODIUM: 140 meq/L (ref 135–145)

## 2014-09-10 MED ORDER — ALBUTEROL SULFATE HFA 108 (90 BASE) MCG/ACT IN AERS: 2.0000 | INHALATION_SPRAY | Freq: Four times a day (QID) | RESPIRATORY_TRACT | Status: DC | PRN

## 2014-09-10 MED ORDER — FLUTICASONE-SALMETEROL 100-50 MCG/DOSE IN AEPB: 1.0000 | INHALATION_SPRAY | Freq: Two times a day (BID) | RESPIRATORY_TRACT | Status: DC

## 2014-09-10 NOTE — Patient Instructions (Addendum)
blood tests are being requested for you today.  We'll contact you with results.  if ever you have fever while taking methimazole, stop it and call us, because of the risk of a rare side-effect i have sent a prescription to your pharmacy, for 2 inhalers (1 is twice a day, until you are better, and the other is as-needed).  I hope you feel better soon.  If you don't feel better in 1 week, please call back.  Please call sooner if you get worse.

## 2014-09-10 NOTE — Progress Notes (Signed)
Subjective:    Patient ID: Harry Mathews, male    DOB: August 13, 1970, 44 y.o.   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   Pt states few weeks of slight wheezing in the chest, and assoc diarrhea.   Past Medical History  Diagnosis Date  . HYPERTHYROIDISM 02/23/2008  . UNSPECIFIED DISORDER OF URETHRA&URINARY TRACT 04/26/2008    Past Surgical History  Procedure Laterality Date  . Vasectomy  2009    History   Social History  . Marital Status: Married    Spouse Name: N/A    Number of Children: 3  . Years of Education: N/A   Occupational History  . Construction    Social History Main Topics  . Smoking status: Never Smoker   . Smokeless tobacco: Not on file  . Alcohol Use: Yes     Comment: social  . Drug Use:   . Sexual Activity:    Other Topics Concern  . Not on file   Social History Narrative  . No narrative on file    Current Outpatient Prescriptions on File Prior to Visit  Medication Sig Dispense Refill  . methimazole (TAPAZOLE) 10 MG tablet TAKE ONE TABLET BY MOUTH THREE TIMES DAILY   90 tablet  0   No current facility-administered medications on file prior to visit.    Allergies  Allergen Reactions  . Penicillins     Family History  Problem Relation Age of Onset  . COPD Neg Hx     BP 118/70  Pulse 85  Temp(Src) 97.6 F (36.4 C) (Oral)  Ht  (1.854 m)  Wt 225 lb (102.059 kg)  BMI 29.69 kg/m2  SpO2 93%   Review of Systems He had fever only once, and that was 6 days ago.  Denies n/v/BRBPR.      Objective:   Physical Exam VS: see vs page GEN: no distress HEAD: head: no deformity eyes: no periorbital swelling, no proptosis external nose and ears are normal mouth: no lesion seen NECK: supple, thyroid is not enlarged.   CHEST WALL: no deformity LUNGS: clear to auscultation.   CV: reg rate and rhythm, no murmur ABD: abdomen is  soft, nontender.  no hepatosplenomegaly.  not distended.  no hernia SKIN:  Normal texture and temperature.  No rash or suspicious lesion is visible.   NODES:  None palpable at the neck PSYCH: alert, well-oriented.  Does not appear anxious nor depressed.   Lab Results  Component Value Date   WBC 7.5 02/20/2012   HGB 15.9 02/20/2012   HCT 47.0 02/20/2012   PLT 193.0 02/20/2012   GLUCOSE 85 09/10/2014   CHOL 90 09/10/2014   TRIG 38.0 09/10/2014   HDL 34.30* 09/10/2014   LDLCALC 48 09/10/2014   ALT 26 09/10/2014   AST 26 09/10/2014   NA 140 09/10/2014   K 3.7 09/10/2014   CL 107 09/10/2014   CREATININE 1.2 09/10/2014   BUN 18 09/10/2014   CO2 25 09/10/2014   TSH 1.61 09/10/2014   HGBA1C 5.4 08/02/2011       Assessment & Plan:  Wheezing, new, prob allergic.  Pt declines spirometry Hyperthyroidism: well-controlled Diarrhea: usually viral.  Pt advised this will most likely get better with time.   Patient is advised the following: Patient Instructions  blood tests are being requested for you today.  We'll contact you with results.  if ever you have fever while taking methimazole, stop it and call us, because of the risk of a rare side-effect i have sent a prescription to your pharmacy, for 2 inhalers (1 is twice a day, until you are better, and the other is as-needed).  I hope you feel better soon.  If you don't feel better in 1 week, please call back.  Please call sooner if you get worse.

## 2014-10-20 ENCOUNTER — Other Ambulatory Visit: Payer: Self-pay | Admitting: Endocrinology

## 2015-01-12 ENCOUNTER — Other Ambulatory Visit: Payer: Self-pay | Admitting: Endocrinology

## 2015-08-09 ENCOUNTER — Other Ambulatory Visit: Payer: Self-pay | Admitting: Endocrinology

## 2015-12-10 ENCOUNTER — Other Ambulatory Visit: Payer: Self-pay | Admitting: Endocrinology

## 2015-12-12 NOTE — Telephone Encounter (Signed)
Please advise if ok to refill last office visit was on 09/10/2014.

## 2015-12-12 NOTE — Telephone Encounter (Signed)
Please refill each x 1 month cpx is due

## 2016-01-12 ENCOUNTER — Other Ambulatory Visit: Payer: Self-pay | Admitting: Endocrinology

## 2016-01-12 NOTE — Telephone Encounter (Signed)
Please advise if okt to refill. Last office visit was 09/10/2014.

## 2016-01-12 NOTE — Telephone Encounter (Signed)
Please refill x 1 Ov is due  

## 2016-05-23 ENCOUNTER — Ambulatory Visit
Admission: RE | Admit: 2016-05-23 | Discharge: 2016-05-23 | Disposition: A | Discharge status: Home / Self Care | Payer: BLUE CROSS/BLUE SHIELD | Source: Ambulatory Visit | Attending: Endocrinology | Admitting: Endocrinology

## 2016-05-23 ENCOUNTER — Encounter: Payer: Self-pay | Admitting: Endocrinology

## 2016-05-23 ENCOUNTER — Ambulatory Visit (INDEPENDENT_AMBULATORY_CARE_PROVIDER_SITE_OTHER): Payer: BLUE CROSS/BLUE SHIELD | Admitting: Endocrinology

## 2016-05-23 VITALS — BP 134/88 | HR 66 | Temp 98.0°F | Wt 239.0 lb

## 2016-05-23 DIAGNOSIS — S9031XA Contusion of right foot, initial encounter: Secondary | ICD-10-CM

## 2016-05-23 DIAGNOSIS — E0590 Thyrotoxicosis, unspecified without thyrotoxic crisis or storm: Secondary | ICD-10-CM | POA: Diagnosis not present

## 2016-05-23 DIAGNOSIS — R310 Gross hematuria: Secondary | ICD-10-CM

## 2016-05-23 DIAGNOSIS — Z0000 Encounter for general adult medical examination without abnormal findings: Secondary | ICD-10-CM | POA: Diagnosis not present

## 2016-05-23 LAB — CBC WITH DIFFERENTIAL/PLATELET
BASOS ABS: 0 10*3/uL (ref 0.0–0.1)
Basophils Relative: 0.3 % (ref 0.0–3.0)
EOS ABS: 0.2 10*3/uL (ref 0.0–0.7)
EOS PCT: 2.1 % (ref 0.0–5.0)
HCT: 46.3 % (ref 39.0–52.0)
HEMOGLOBIN: 15.6 g/dL (ref 13.0–17.0)
Lymphocytes Relative: 13.7 % (ref 12.0–46.0)
Lymphs Abs: 1.2 10*3/uL (ref 0.7–4.0)
MCHC: 33.6 g/dL (ref 30.0–36.0)
MCV: 88.9 fl (ref 78.0–100.0)
MONO ABS: 0.6 10*3/uL (ref 0.1–1.0)
Monocytes Relative: 7 % (ref 3.0–12.0)
NEUTROS PCT: 76.9 % (ref 43.0–77.0)
Neutro Abs: 6.6 10*3/uL (ref 1.4–7.7)
Platelets: 188 10*3/uL (ref 150.0–400.0)
RBC: 5.21 Mil/uL (ref 4.22–5.81)
RDW: 13.1 % (ref 11.5–15.5)
WBC: 8.6 10*3/uL (ref 4.0–10.5)

## 2016-05-23 LAB — BASIC METABOLIC PANEL
BUN: 17 mg/dL (ref 6–23)
CALCIUM: 9.6 mg/dL (ref 8.4–10.5)
CO2: 30 meq/L (ref 19–32)
Chloride: 106 mEq/L (ref 96–112)
Creatinine, Ser: 1.17 mg/dL (ref 0.40–1.50)
GFR: 71.87 mL/min (ref >60.00)
GLUCOSE: 106 mg/dL — AB (ref 70–99)
Potassium: 3.6 mEq/L (ref 3.5–5.1)
Sodium: 142 mEq/L (ref 135–145)

## 2016-05-23 LAB — HEPATIC FUNCTION PANEL
ALT: 27 U/L (ref 0–53)
AST: 26 U/L (ref 0–37)
Albumin: 4.6 g/dL (ref 3.5–5.2)
Alkaline Phosphatase: 95 U/L (ref 39–117)
Bilirubin, Direct: 0.2 mg/dL (ref 0.0–0.3)
Total Bilirubin: 0.9 mg/dL (ref 0.2–1.2)
Total Protein: 6.7 g/dL (ref 6.0–8.3)

## 2016-05-23 LAB — LIPID PANEL
Cholesterol: 116 mg/dL (ref 0–200)
HDL: 46.4 mg/dL (ref >39.00)
LDL Cholesterol: 57 mg/dL (ref 0–99)
NonHDL: 69.45
Total CHOL/HDL Ratio: 2
Triglycerides: 62 mg/dL (ref 0.0–149.0)
VLDL: 12.4 mg/dL (ref 0.0–40.0)

## 2016-05-23 LAB — TSH: TSH: 2.34 u[IU]/mL (ref 0.35–4.50)

## 2016-05-23 LAB — T4, FREE: Free T4: 0.79 ng/dL (ref 0.60–1.60)

## 2016-05-23 LAB — HEMOGLOBIN A1C: Hgb A1c MFr Bld: 5.5 % (ref 4.6–6.5)

## 2016-05-23 NOTE — Patient Instructions (Addendum)
blood tests and x-rays are requested for you today.  We'll contact you with results.  i'll see you soon.  Please continue the same medications

## 2016-05-23 NOTE — Progress Notes (Signed)
   Subjective:    Patient ID: Harry Mathews, male    DOB: August 22, 1970, 46 y.o.   MRN: [id number]  HPI A few hrs ago, pt struck his right foot on stairs.  Since then, he has moderate pain there, but no assoc abrasion.  Past Medical History  Diagnosis Date  . HYPERTHYROIDISM 02/23/2008  . UNSPECIFIED DISORDER OF URETHRA&URINARY TRACT 04/26/2008    Past Surgical History  Procedure Laterality Date  . Vasectomy  2009    Social History   Social History  . Marital Status: Married    Spouse Name: N/A  . Number of Children: 3  . Years of Education: N/A   Occupational History  . Construction    Social History Main Topics  . Smoking status: Never Smoker   . Smokeless tobacco: Not on file  . Alcohol Use: Yes     Comment: social  . Drug Use: Not on file  . Sexual Activity: Not on file   Other Topics Concern  . Not on file   Social History Narrative    Current Outpatient Prescriptions on File Prior to Visit  Medication Sig Dispense Refill  . ADVAIR DISKUS 100-50 MCG/DOSE AEPB INAHEL ONE PUFF BY MOUTH INTO LUNGS TWICE DAILY 60 each 0  . methimazole (TAPAZOLE) 10 MG tablet TAKE ONE TABLET BY MOUTH THREE TIMES DAILY (Patient taking differently: TAKE ONE TABLET BY MOUTH ONE TIME DAILY) 90 tablet 0  . VENTOLIN HFA 108 (90 BASE) MCG/ACT inhaler INHALE TWO PUFFS BY MOUTH EVERY SIX HOURS AS NEEDED FOR WHEEZING 18 Inhaler 0   No current facility-administered medications on file prior to visit.    Allergies  Allergen Reactions  . Penicillins     Family History  Problem Relation Age of Onset  . COPD Neg Hx     BP 134/88 mmHg  Pulse 66  Temp(Src) 98 F (36.7 C) (Oral)  Wt 239 lb (108.41 kg)  SpO2 97%  Review of Systems Denies numbness or LOC    Objective:   Physical Exam VITAL SIGNS:  See vs page GENERAL: no distress Right foot: slight tenderness at the instep area.  No swell/break in the skin/erythema  Lab Results  Component Value Date   TSH 2.34 05/23/2016        Assessment & Plan:  Foot contusion, new Hyperthyroidism: well-controlled.   Patient is advised the following: Patient Instructions  blood tests and x-rays are requested for you today.  We'll contact you with results.  i'll see you soon.  Please continue the same medications

## 2016-05-31 ENCOUNTER — Ambulatory Visit: Payer: Self-pay | Admitting: Endocrinology

## 2016-06-06 ENCOUNTER — Encounter: Payer: Self-pay | Admitting: Endocrinology

## 2016-11-06 ENCOUNTER — Other Ambulatory Visit: Payer: Self-pay | Admitting: Endocrinology

## 2016-12-10 ENCOUNTER — Other Ambulatory Visit: Payer: Self-pay | Admitting: Endocrinology

## 2016-12-10 NOTE — Telephone Encounter (Signed)
Please refill x 3 mos Ov is due 

## 2016-12-11 ENCOUNTER — Encounter: Payer: Self-pay | Admitting: Endocrinology

## 2016-12-11 ENCOUNTER — Telehealth: Payer: Self-pay | Admitting: Endocrinology

## 2016-12-11 ENCOUNTER — Ambulatory Visit (INDEPENDENT_AMBULATORY_CARE_PROVIDER_SITE_OTHER): Payer: BLUE CROSS/BLUE SHIELD | Admitting: Endocrinology

## 2016-12-11 DIAGNOSIS — B029 Zoster without complications: Secondary | ICD-10-CM | POA: Diagnosis not present

## 2016-12-11 MED ORDER — VALACYCLOVIR HCL 1 G PO TABS: 1000.0000 mg | ORAL_TABLET | Freq: Three times a day (TID) | ORAL | 0 refills | Status: AC

## 2016-12-11 NOTE — Telephone Encounter (Signed)
Pt called in and said he has what he believes is shingles. He wants to know if Dr. Everardo AllEllison can see him this week. Please advise.

## 2016-12-11 NOTE — Telephone Encounter (Signed)
Judeth CornfieldStephanie,  Could we please add him to the schedule? Thanks!

## 2016-12-11 NOTE — Telephone Encounter (Signed)
See message and please advise, Thanks!  

## 2016-12-11 NOTE — Patient Instructions (Addendum)
blood tests are requested for you today.  We'll let you know about the results. I have sent a prescription to your pharmacy, against the virus.  Please call if you want to see a specialist for the heel pain.   I hope you feel better soon.

## 2016-12-11 NOTE — Telephone Encounter (Signed)
3pm today

## 2016-12-11 NOTE — Telephone Encounter (Signed)
I contacted the patient and advised of message on mobile voicemail. Patient was advised to call back and confirm the 3 pm appointment.

## 2016-12-11 NOTE — Progress Notes (Signed)
   Subjective:    Patient ID: Harry Mathews, male    DOB: [birth year], 46 y.o.   MRN: [id number]  HPI Pt states 5 days of right lateral chest wall pain.  He now has 1 day of rash there.   Past Medical History:  Diagnosis Date  . HYPERTHYROIDISM 02/23/2008  . 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 VASECTOMY  2009    Social History   Social History  . Marital status: Married    Spouse name: N/A  . Number of children: 3  . Years of education: N/A   Occupational History  . Construction    Social History Main Topics  . Smoking status: Never Smoker  . Smokeless tobacco: Not on file  . Alcohol use Yes     Comment: social  . Drug use: Unknown  . Sexual activity: Not on file   Other Topics Concern  . Not on file   Social History Narrative  . No narrative on file    Current Outpatient Prescriptions on File Prior to Visit  Medication Sig Dispense Refill  . ADVAIR DISKUS 100-50 MCG/DOSE AEPB INAHEL ONE PUFF BY MOUTH INTO LUNGS TWICE DAILY 60 each 0  . methimazole (TAPAZOLE) 10 MG tablet TAKE ONE TABLET BY MOUTH THREE TIMES DAILY 90 tablet 0  . VENTOLIN HFA 108 (90 BASE) MCG/ACT inhaler INHALE TWO PUFFS BY MOUTH EVERY SIX HOURS AS NEEDED FOR WHEEZING 18 Inhaler 0   No current facility-administered medications on file prior to visit.     Allergies  Allergen Reactions  . Penicillins     Family History  Problem Relation Age of Onset  . COPD Neg Hx     BP 128/84   Pulse 71   Ht 6\' 1"  (1.854 m)   Wt 241 lb (109.3 kg)   SpO2 97%   BMI 31.80 kg/m    Review of Systems Denies fever and itching.  He has pain at the right achilles tendon.     Objective:   Physical Exam VITAL SIGNS:  See vs page GENERAL: no distress Skin: moderate polyvesicular rash at the right chest wall, and at the RUQ of the abdomen.     Assessment & Plan:  Zoster, new.  Check CBC, as he is young to have  zoster. Hyperthyroidism: due for recheck Heel pain, new  Patient is advised the following: Patient Instructions  blood tests are requested for you today.  We'll let you know about the results. I have sent a prescription to your pharmacy, against the virus.  Please call if you want to see a specialist for the heel pain.   I hope you feel better soon.

## 2016-12-11 NOTE — Telephone Encounter (Signed)
Patient call to confirm appt for 3:00

## 2016-12-12 LAB — CBC WITH DIFFERENTIAL/PLATELET
BASOS PCT: 0.2 % (ref 0.0–3.0)
Basophils Absolute: 0 10*3/uL (ref 0.0–0.1)
EOS ABS: 0.2 10*3/uL (ref 0.0–0.7)
EOS PCT: 3.7 % (ref 0.0–5.0)
HEMATOCRIT: 45.6 % (ref 39.0–52.0)
HEMOGLOBIN: 15.6 g/dL (ref 13.0–17.0)
LYMPHS PCT: 19.5 % (ref 12.0–46.0)
Lymphs Abs: 1.1 10*3/uL (ref 0.7–4.0)
MCHC: 34.1 g/dL (ref 30.0–36.0)
MCV: 89 fl (ref 78.0–100.0)
Monocytes Absolute: 0.7 10*3/uL (ref 0.1–1.0)
Monocytes Relative: 12.3 % — ABNORMAL HIGH (ref 3.0–12.0)
Neutro Abs: 3.7 10*3/uL (ref 1.4–7.7)
Neutrophils Relative %: 64.3 % (ref 43.0–77.0)
Platelets: 203 10*3/uL (ref 150.0–400.0)
RBC: 5.13 Mil/uL (ref 4.22–5.81)
RDW: 12.7 % (ref 11.5–15.5)
WBC: 5.7 10*3/uL (ref 4.0–10.5)

## 2016-12-12 LAB — TSH: TSH: 2.65 u[IU]/mL (ref 0.35–4.50)

## 2016-12-13 ENCOUNTER — Encounter: Payer: Self-pay | Admitting: Endocrinology

## 2016-12-13 ENCOUNTER — Other Ambulatory Visit: Payer: Self-pay

## 2016-12-14 DIAGNOSIS — B029 Zoster without complications: Secondary | ICD-10-CM | POA: Diagnosis not present

## 2016-12-14 DIAGNOSIS — B079 Viral wart, unspecified: Secondary | ICD-10-CM | POA: Diagnosis not present

## 2017-01-01 ENCOUNTER — Other Ambulatory Visit: Payer: Self-pay

## 2017-01-01 MED ORDER — ALBUTEROL SULFATE HFA 108 (90 BASE) MCG/ACT IN AERS: INHALATION_SPRAY | RESPIRATORY_TRACT | 0 refills | Status: DC

## 2017-07-29 ENCOUNTER — Emergency Department (HOSPITAL_COMMUNITY): Payer: BLUE CROSS/BLUE SHIELD

## 2017-07-29 ENCOUNTER — Emergency Department (HOSPITAL_COMMUNITY)
Admission: EM | Admit: 2017-07-29 | Discharge: 2017-07-29 | Disposition: A | Discharge status: Home / Self Care | Payer: BLUE CROSS/BLUE SHIELD | Attending: Emergency Medicine | Admitting: Emergency Medicine

## 2017-07-29 ENCOUNTER — Encounter (HOSPITAL_COMMUNITY): Payer: Self-pay | Admitting: Emergency Medicine

## 2017-07-29 DIAGNOSIS — S20312A Abrasion of left front wall of thorax, initial encounter: Secondary | ICD-10-CM | POA: Diagnosis not present

## 2017-07-29 DIAGNOSIS — T148XXA Other injury of unspecified body region, initial encounter: Secondary | ICD-10-CM | POA: Diagnosis not present

## 2017-07-29 DIAGNOSIS — Y999 Unspecified external cause status: Secondary | ICD-10-CM | POA: Insufficient documentation

## 2017-07-29 DIAGNOSIS — Z79899 Other long term (current) drug therapy: Secondary | ICD-10-CM | POA: Diagnosis not present

## 2017-07-29 DIAGNOSIS — Y92410 Unspecified street and highway as the place of occurrence of the external cause: Secondary | ICD-10-CM | POA: Insufficient documentation

## 2017-07-29 DIAGNOSIS — S0990XA Unspecified injury of head, initial encounter: Secondary | ICD-10-CM | POA: Diagnosis not present

## 2017-07-29 DIAGNOSIS — M79602 Pain in left arm: Secondary | ICD-10-CM | POA: Diagnosis not present

## 2017-07-29 DIAGNOSIS — G44319 Acute post-traumatic headache, not intractable: Secondary | ICD-10-CM | POA: Insufficient documentation

## 2017-07-29 DIAGNOSIS — S20212A Contusion of left front wall of thorax, initial encounter: Secondary | ICD-10-CM | POA: Diagnosis not present

## 2017-07-29 DIAGNOSIS — R079 Chest pain, unspecified: Secondary | ICD-10-CM | POA: Diagnosis not present

## 2017-07-29 DIAGNOSIS — S299XXA Unspecified injury of thorax, initial encounter: Secondary | ICD-10-CM | POA: Diagnosis not present

## 2017-07-29 DIAGNOSIS — Y9389 Activity, other specified: Secondary | ICD-10-CM | POA: Insufficient documentation

## 2017-07-29 MED ORDER — KETOROLAC TROMETHAMINE 60 MG/2ML IM SOLN
60.0000 mg | Freq: Once | INTRAMUSCULAR | Status: DC
  Filled 2017-07-29: qty 2

## 2017-07-29 MED ORDER — CYCLOBENZAPRINE HCL 10 MG PO TABS: 10.0000 mg | ORAL_TABLET | Freq: Two times a day (BID) | ORAL | 0 refills | Status: AC | PRN

## 2017-07-29 MED ORDER — ACETAMINOPHEN 500 MG PO TABS
1000.0000 mg | ORAL_TABLET | Freq: Once | ORAL | Status: AC
  Administered 2017-07-29: 1000 mg via ORAL
  Filled 2017-07-29: qty 2
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
## 2017-07-29 NOTE — ED Notes (Signed)
Pt in xray
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
## 2017-07-29 NOTE — ED Notes (Signed)
Patient transported to CT 
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
## 2017-12-11 DIAGNOSIS — B078 Other viral warts: Secondary | ICD-10-CM | POA: Diagnosis not present

## 2017-12-11 DIAGNOSIS — L570 Actinic keratosis: Secondary | ICD-10-CM | POA: Diagnosis not present

## 2018-01-10 ENCOUNTER — Other Ambulatory Visit: Payer: Self-pay | Admitting: Endocrinology

## 2018-06-16 DIAGNOSIS — K644 Residual hemorrhoidal skin tags: Secondary | ICD-10-CM | POA: Diagnosis not present

## 2018-06-16 DIAGNOSIS — R1084 Generalized abdominal pain: Secondary | ICD-10-CM | POA: Diagnosis not present

## 2018-06-16 DIAGNOSIS — R634 Abnormal weight loss: Secondary | ICD-10-CM | POA: Diagnosis not present

## 2018-06-16 DIAGNOSIS — R197 Diarrhea, unspecified: Secondary | ICD-10-CM | POA: Diagnosis not present

## 2018-06-18 DIAGNOSIS — K6389 Other specified diseases of intestine: Secondary | ICD-10-CM | POA: Diagnosis not present

## 2018-06-18 DIAGNOSIS — K5289 Other specified noninfective gastroenteritis and colitis: Secondary | ICD-10-CM | POA: Diagnosis not present

## 2018-06-18 DIAGNOSIS — K602 Anal fissure, unspecified: Secondary | ICD-10-CM | POA: Diagnosis not present

## 2018-06-18 DIAGNOSIS — K208 Other esophagitis: Secondary | ICD-10-CM | POA: Diagnosis not present

## 2018-06-18 DIAGNOSIS — K633 Ulcer of intestine: Secondary | ICD-10-CM | POA: Diagnosis not present

## 2018-06-18 DIAGNOSIS — K317 Polyp of stomach and duodenum: Secondary | ICD-10-CM | POA: Diagnosis not present

## 2018-06-18 DIAGNOSIS — K2930 Chronic superficial gastritis without bleeding: Secondary | ICD-10-CM | POA: Diagnosis not present

## 2018-11-11 ENCOUNTER — Encounter: Payer: Self-pay | Admitting: Endocrinology

## 2018-11-11 ENCOUNTER — Ambulatory Visit: Payer: BLUE CROSS/BLUE SHIELD | Admitting: Endocrinology

## 2018-11-11 ENCOUNTER — Ambulatory Visit (INDEPENDENT_AMBULATORY_CARE_PROVIDER_SITE_OTHER): Payer: BLUE CROSS/BLUE SHIELD | Admitting: Endocrinology

## 2018-11-11 VITALS — BP 128/70 | HR 73 | Ht 73.0 in | Wt 234.6 lb

## 2018-11-11 DIAGNOSIS — E0590 Thyrotoxicosis, unspecified without thyrotoxic crisis or storm: Secondary | ICD-10-CM | POA: Diagnosis not present

## 2018-11-11 LAB — TSH: TSH: 2.9 u[IU]/mL (ref 0.35–4.50)

## 2018-11-11 LAB — T4, FREE: Free T4: 0.9 ng/dL (ref 0.60–1.60)

## 2018-11-11 MED ORDER — BENZONATATE 200 MG PO CAPS: 200.0000 mg | ORAL_CAPSULE | Freq: Three times a day (TID) | ORAL | 0 refills | Status: AC | PRN

## 2018-11-11 MED ORDER — FLUTICASONE-SALMETEROL 100-50 MCG/DOSE IN AEPB: 1.0000 | INHALATION_SPRAY | Freq: Two times a day (BID) | RESPIRATORY_TRACT | 2 refills | Status: DC

## 2018-11-11 MED ORDER — ALBUTEROL SULFATE HFA 108 (90 BASE) MCG/ACT IN AERS: 1.0000 | INHALATION_SPRAY | RESPIRATORY_TRACT | 2 refills | Status: AC | PRN

## 2018-11-11 MED ORDER — METHIMAZOLE 10 MG PO TABS: 10.0000 mg | ORAL_TABLET | Freq: Every day | ORAL | 3 refills | Status: DC

## 2018-11-11 NOTE — Patient Instructions (Addendum)
blood tests are requested for you today.  We'll let you know about the results. If ever you have fever while taking methimazole, stop it and call us, even if the reason is obvious, because of the risk of a rare side-effect. I have sent a prescription to your pharmacy, for the cough, and to refill the inhalers.   Please come back for a follow-up appointment in 6 months.

## 2018-11-11 NOTE — Progress Notes (Signed)
Subjective:    Patient ID: Harry Mathews, male    DOB: 17-Jul-1970, 48 y.o.   MRN: [id number]  HPI Pt returns for f/u of hyperthyroidism due to Grave's dx (dx'ed 2009; nuc med scan showed elevated uptake, but pt chose tapazole rx).  He has a few weeks of slight wheezing in the chest, and assoc cough.  Past Medical History:  Diagnosis Date  . HYPERTHYROIDISM 02/23/2008  . 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 VASECTOMY  2009    Social History   Socioeconomic History  . Marital status: Married    Spouse name: Not on file  . Number of children: 3  . Years of education: Not on file  . Highest education level: Not on file  Occupational History  . Occupation: Training and development officerConstruction  Social Needs  . Financial resource strain: Not on file  . Food insecurity:    Worry: Not on file    Inability: Not on file  . Transportation needs:    Medical: Not on file    Non-medical: Not on file  Tobacco Use  . Smoking status: Never Smoker  . Smokeless tobacco: Never Used  Substance and Sexual Activity  . Alcohol use: Yes    Comment: social  . Drug use: Never  . Sexual activity: Not Currently  Lifestyle  . Physical activity:    Days per week: Not on file    Minutes per session: Not on file  . Stress: Not on file  Relationships  . Social connections:    Talks on phone: Not on file    Gets together: Not on file    Attends religious service: Not on file    Active member of club or organization: Not on file    Attends meetings of clubs or organizations: Not on file    Relationship status: Not on file  . Intimate partner violence:    Fear of current or ex partner: Not on file    Emotionally abused: Not on file    Physically abused: Not on file    Forced sexual activity: Not on file  Other Topics Concern  . Not on file  Social History Narrative  . Not on file    Current Outpatient Medications on File Prior to  Visit  Medication Sig Dispense Refill  . cyclobenzaprine (FLEXERIL) 10 MG tablet Take 1 tablet (10 mg total) by mouth 2 (two) times daily as needed for muscle spasms. 20 tablet 0  . ibuprofen (ADVIL,MOTRIN) 200 MG tablet Take 400 mg by mouth every 6 (six) hours as needed for moderate pain.    . valACYclovir (VALTREX) 1000 MG tablet Take 1 tablet (1,000 mg total) by mouth 3 (three) times daily. 21 tablet 0   No current facility-administered medications on file prior to visit.     Allergies  Allergen Reactions  . Penicillins     Family History  Problem Relation Age of Onset  . COPD Neg Hx     BP 128/70 (BP Location: Right Arm, Patient Position: Sitting, Cuff Size: Normal)   Pulse 73   Ht 6\' 1"  (1.854 m)   Wt 234 lb 9.6 oz (106.4 kg)   SpO2 96%   BMI 30.95 kg/m    Review of Systems Denies fever and nasal congestion.      Objective:   Physical Exam VITAL SIGNS:  See vs page GENERAL: no distress NECK: There is no palpable thyroid enlargement.  No thyroid nodule is palpable.  No palpable lymphadenopathy at the anterior neck.       Assessment & Plan:  Cough, new to me, uncertain etiology.  CXR is declined.   Hyperthyroidism: recheck today.   Patient Instructions  blood tests are requested for you today.  We'll let you know about the results. If ever you have fever while taking methimazole, stop it and call us, even if the reason is obvious, because of the risk of a rare side-effect. I have sent a prescription to your pharmacy, for the cough, and to refill the inhalers.   Please come back for a follow-up appointment in 6 months.

## 2018-11-22 DIAGNOSIS — M546 Pain in thoracic spine: Secondary | ICD-10-CM | POA: Diagnosis not present

## 2018-11-22 DIAGNOSIS — M9901 Segmental and somatic dysfunction of cervical region: Secondary | ICD-10-CM | POA: Diagnosis not present

## 2018-11-22 DIAGNOSIS — M6019 Interstitial myositis, multiple sites: Secondary | ICD-10-CM | POA: Diagnosis not present

## 2018-11-22 DIAGNOSIS — M9904 Segmental and somatic dysfunction of sacral region: Secondary | ICD-10-CM | POA: Diagnosis not present

## 2018-11-22 DIAGNOSIS — M9905 Segmental and somatic dysfunction of pelvic region: Secondary | ICD-10-CM | POA: Diagnosis not present

## 2018-11-22 DIAGNOSIS — M25552 Pain in left hip: Secondary | ICD-10-CM | POA: Diagnosis not present

## 2018-11-22 DIAGNOSIS — M9902 Segmental and somatic dysfunction of thoracic region: Secondary | ICD-10-CM | POA: Diagnosis not present

## 2018-11-22 DIAGNOSIS — M25551 Pain in right hip: Secondary | ICD-10-CM | POA: Diagnosis not present

## 2018-12-05 DIAGNOSIS — M546 Pain in thoracic spine: Secondary | ICD-10-CM | POA: Diagnosis not present

## 2018-12-05 DIAGNOSIS — M9903 Segmental and somatic dysfunction of lumbar region: Secondary | ICD-10-CM | POA: Diagnosis not present

## 2018-12-05 DIAGNOSIS — S39012A Strain of muscle, fascia and tendon of lower back, initial encounter: Secondary | ICD-10-CM | POA: Diagnosis not present

## 2018-12-05 DIAGNOSIS — M9904 Segmental and somatic dysfunction of sacral region: Secondary | ICD-10-CM | POA: Diagnosis not present

## 2018-12-05 DIAGNOSIS — M25552 Pain in left hip: Secondary | ICD-10-CM | POA: Diagnosis not present

## 2018-12-05 DIAGNOSIS — M25551 Pain in right hip: Secondary | ICD-10-CM | POA: Diagnosis not present

## 2018-12-05 DIAGNOSIS — M9905 Segmental and somatic dysfunction of pelvic region: Secondary | ICD-10-CM | POA: Diagnosis not present

## 2018-12-05 DIAGNOSIS — M9902 Segmental and somatic dysfunction of thoracic region: Secondary | ICD-10-CM | POA: Diagnosis not present

## 2018-12-08 DIAGNOSIS — M9903 Segmental and somatic dysfunction of lumbar region: Secondary | ICD-10-CM | POA: Diagnosis not present

## 2018-12-08 DIAGNOSIS — M25552 Pain in left hip: Secondary | ICD-10-CM | POA: Diagnosis not present

## 2018-12-08 DIAGNOSIS — M25551 Pain in right hip: Secondary | ICD-10-CM | POA: Diagnosis not present

## 2018-12-08 DIAGNOSIS — M9905 Segmental and somatic dysfunction of pelvic region: Secondary | ICD-10-CM | POA: Diagnosis not present

## 2018-12-08 DIAGNOSIS — S39012A Strain of muscle, fascia and tendon of lower back, initial encounter: Secondary | ICD-10-CM | POA: Diagnosis not present

## 2018-12-08 DIAGNOSIS — M9902 Segmental and somatic dysfunction of thoracic region: Secondary | ICD-10-CM | POA: Diagnosis not present

## 2018-12-08 DIAGNOSIS — M9904 Segmental and somatic dysfunction of sacral region: Secondary | ICD-10-CM | POA: Diagnosis not present

## 2018-12-08 DIAGNOSIS — M546 Pain in thoracic spine: Secondary | ICD-10-CM | POA: Diagnosis not present

## 2018-12-12 DIAGNOSIS — M25551 Pain in right hip: Secondary | ICD-10-CM | POA: Diagnosis not present

## 2018-12-12 DIAGNOSIS — M9904 Segmental and somatic dysfunction of sacral region: Secondary | ICD-10-CM | POA: Diagnosis not present

## 2018-12-12 DIAGNOSIS — M9901 Segmental and somatic dysfunction of cervical region: Secondary | ICD-10-CM | POA: Diagnosis not present

## 2018-12-12 DIAGNOSIS — M6019 Interstitial myositis, multiple sites: Secondary | ICD-10-CM | POA: Diagnosis not present

## 2018-12-12 DIAGNOSIS — M25552 Pain in left hip: Secondary | ICD-10-CM | POA: Diagnosis not present

## 2018-12-12 DIAGNOSIS — M9902 Segmental and somatic dysfunction of thoracic region: Secondary | ICD-10-CM | POA: Diagnosis not present

## 2018-12-12 DIAGNOSIS — M546 Pain in thoracic spine: Secondary | ICD-10-CM | POA: Diagnosis not present

## 2018-12-12 DIAGNOSIS — M9905 Segmental and somatic dysfunction of pelvic region: Secondary | ICD-10-CM | POA: Diagnosis not present

## 2018-12-22 DIAGNOSIS — M9902 Segmental and somatic dysfunction of thoracic region: Secondary | ICD-10-CM | POA: Diagnosis not present

## 2018-12-22 DIAGNOSIS — M9901 Segmental and somatic dysfunction of cervical region: Secondary | ICD-10-CM | POA: Diagnosis not present

## 2018-12-22 DIAGNOSIS — M25552 Pain in left hip: Secondary | ICD-10-CM | POA: Diagnosis not present

## 2018-12-22 DIAGNOSIS — M6019 Interstitial myositis, multiple sites: Secondary | ICD-10-CM | POA: Diagnosis not present

## 2018-12-22 DIAGNOSIS — M9904 Segmental and somatic dysfunction of sacral region: Secondary | ICD-10-CM | POA: Diagnosis not present

## 2018-12-22 DIAGNOSIS — M9905 Segmental and somatic dysfunction of pelvic region: Secondary | ICD-10-CM | POA: Diagnosis not present

## 2018-12-22 DIAGNOSIS — M546 Pain in thoracic spine: Secondary | ICD-10-CM | POA: Diagnosis not present

## 2018-12-22 DIAGNOSIS — M25551 Pain in right hip: Secondary | ICD-10-CM | POA: Diagnosis not present

## 2018-12-26 DIAGNOSIS — M25552 Pain in left hip: Secondary | ICD-10-CM | POA: Diagnosis not present

## 2018-12-26 DIAGNOSIS — M9902 Segmental and somatic dysfunction of thoracic region: Secondary | ICD-10-CM | POA: Diagnosis not present

## 2018-12-26 DIAGNOSIS — M6019 Interstitial myositis, multiple sites: Secondary | ICD-10-CM | POA: Diagnosis not present

## 2018-12-26 DIAGNOSIS — M9901 Segmental and somatic dysfunction of cervical region: Secondary | ICD-10-CM | POA: Diagnosis not present

## 2018-12-26 DIAGNOSIS — M546 Pain in thoracic spine: Secondary | ICD-10-CM | POA: Diagnosis not present

## 2018-12-26 DIAGNOSIS — M25551 Pain in right hip: Secondary | ICD-10-CM | POA: Diagnosis not present

## 2018-12-26 DIAGNOSIS — M9904 Segmental and somatic dysfunction of sacral region: Secondary | ICD-10-CM | POA: Diagnosis not present

## 2018-12-26 DIAGNOSIS — M9905 Segmental and somatic dysfunction of pelvic region: Secondary | ICD-10-CM | POA: Diagnosis not present

## 2018-12-29 DIAGNOSIS — M9905 Segmental and somatic dysfunction of pelvic region: Secondary | ICD-10-CM | POA: Diagnosis not present

## 2018-12-29 DIAGNOSIS — M25552 Pain in left hip: Secondary | ICD-10-CM | POA: Diagnosis not present

## 2018-12-29 DIAGNOSIS — M9901 Segmental and somatic dysfunction of cervical region: Secondary | ICD-10-CM | POA: Diagnosis not present

## 2018-12-29 DIAGNOSIS — M25551 Pain in right hip: Secondary | ICD-10-CM | POA: Diagnosis not present

## 2018-12-29 DIAGNOSIS — M6019 Interstitial myositis, multiple sites: Secondary | ICD-10-CM | POA: Diagnosis not present

## 2018-12-29 DIAGNOSIS — M546 Pain in thoracic spine: Secondary | ICD-10-CM | POA: Diagnosis not present

## 2018-12-29 DIAGNOSIS — M9902 Segmental and somatic dysfunction of thoracic region: Secondary | ICD-10-CM | POA: Diagnosis not present

## 2018-12-29 DIAGNOSIS — M9904 Segmental and somatic dysfunction of sacral region: Secondary | ICD-10-CM | POA: Diagnosis not present

## 2019-01-14 ENCOUNTER — Other Ambulatory Visit: Payer: Self-pay | Admitting: Endocrinology

## 2019-02-12 ENCOUNTER — Other Ambulatory Visit: Payer: Self-pay | Admitting: Endocrinology

## 2019-02-12 NOTE — Telephone Encounter (Signed)
Please refill x 3 months Further refills would have to be considered by new PCP   

## 2019-02-26 ENCOUNTER — Other Ambulatory Visit: Payer: Self-pay | Admitting: Endocrinology

## 2019-03-02 ENCOUNTER — Other Ambulatory Visit: Payer: Self-pay | Admitting: Endocrinology

## 2019-03-20 ENCOUNTER — Encounter: Payer: Self-pay | Admitting: Endocrinology

## 2019-03-31 DIAGNOSIS — E0590 Thyrotoxicosis, unspecified without thyrotoxic crisis or storm: Secondary | ICD-10-CM | POA: Diagnosis not present

## 2019-03-31 DIAGNOSIS — Z6832 Body mass index (BMI) 32.0-32.9, adult: Secondary | ICD-10-CM | POA: Diagnosis not present

## 2019-03-31 DIAGNOSIS — Z0000 Encounter for general adult medical examination without abnormal findings: Secondary | ICD-10-CM | POA: Diagnosis not present

## 2019-05-08 ENCOUNTER — Encounter: Payer: Self-pay | Admitting: Endocrinology

## 2019-05-13 NOTE — Telephone Encounter (Signed)
Pt cancelled appt Canceled: 05/12/2019 8:20 AM By: Shaune Pascal, GRACE C  Cancel Rsn: Other: Public Health Emergency ([facility])

## 2019-05-14 ENCOUNTER — Ambulatory Visit: Payer: BLUE CROSS/BLUE SHIELD | Admitting: Endocrinology

## 2019-05-29 DIAGNOSIS — R509 Fever, unspecified: Secondary | ICD-10-CM | POA: Diagnosis not present

## 2019-05-29 DIAGNOSIS — R05 Cough: Secondary | ICD-10-CM | POA: Diagnosis not present

## 2019-05-29 DIAGNOSIS — Z03818 Encounter for observation for suspected exposure to other biological agents ruled out: Secondary | ICD-10-CM | POA: Diagnosis not present

## 2019-05-30 DIAGNOSIS — R918 Other nonspecific abnormal finding of lung field: Secondary | ICD-10-CM | POA: Diagnosis not present

## 2019-05-30 DIAGNOSIS — R509 Fever, unspecified: Secondary | ICD-10-CM | POA: Diagnosis not present

## 2019-05-30 DIAGNOSIS — Z20828 Contact with and (suspected) exposure to other viral communicable diseases: Secondary | ICD-10-CM | POA: Diagnosis not present

## 2019-05-30 DIAGNOSIS — Z880 Allergy status to penicillin: Secondary | ICD-10-CM | POA: Diagnosis not present

## 2019-05-30 DIAGNOSIS — J189 Pneumonia, unspecified organism: Secondary | ICD-10-CM | POA: Diagnosis not present

## 2019-06-03 DIAGNOSIS — J189 Pneumonia, unspecified organism: Secondary | ICD-10-CM | POA: Diagnosis not present

## 2019-06-04 ENCOUNTER — Ambulatory Visit: Payer: BLUE CROSS/BLUE SHIELD | Admitting: Endocrinology

## 2019-06-12 ENCOUNTER — Telehealth: Payer: Self-pay

## 2019-06-12 NOTE — Telephone Encounter (Signed)
LOV 11/11/18. Per Dr. Loanne Drilling, f/u in 6 mo. Called pt to schedule appts. LVM requesting returned call.

## 2019-06-17 ENCOUNTER — Telehealth: Payer: Self-pay

## 2019-06-17 NOTE — Telephone Encounter (Signed)
LOV 11/11/18. Per Dr. Loanne Drilling, f/u in 6 mo. Called pt to schedule appts. Unable to reach d/t no answer

## 2019-07-17 DIAGNOSIS — J189 Pneumonia, unspecified organism: Secondary | ICD-10-CM | POA: Diagnosis not present

## 2019-07-17 DIAGNOSIS — R05 Cough: Secondary | ICD-10-CM | POA: Diagnosis not present

## 2019-07-17 DIAGNOSIS — R918 Other nonspecific abnormal finding of lung field: Secondary | ICD-10-CM | POA: Diagnosis not present

## 2019-07-24 DIAGNOSIS — R918 Other nonspecific abnormal finding of lung field: Secondary | ICD-10-CM | POA: Diagnosis not present

## 2019-07-24 DIAGNOSIS — R05 Cough: Secondary | ICD-10-CM | POA: Diagnosis not present

## 2019-07-28 DIAGNOSIS — R918 Other nonspecific abnormal finding of lung field: Secondary | ICD-10-CM | POA: Diagnosis not present

## 2019-08-18 DIAGNOSIS — R911 Solitary pulmonary nodule: Secondary | ICD-10-CM | POA: Diagnosis not present

## 2019-08-18 DIAGNOSIS — R062 Wheezing: Secondary | ICD-10-CM | POA: Diagnosis not present

## 2019-08-20 DIAGNOSIS — Z23 Encounter for immunization: Secondary | ICD-10-CM | POA: Diagnosis not present

## 2019-08-20 DIAGNOSIS — F17210 Nicotine dependence, cigarettes, uncomplicated: Secondary | ICD-10-CM | POA: Diagnosis not present

## 2019-08-20 DIAGNOSIS — R918 Other nonspecific abnormal finding of lung field: Secondary | ICD-10-CM | POA: Diagnosis not present

## 2019-09-01 DIAGNOSIS — R590 Localized enlarged lymph nodes: Secondary | ICD-10-CM | POA: Diagnosis not present

## 2019-09-01 DIAGNOSIS — R918 Other nonspecific abnormal finding of lung field: Secondary | ICD-10-CM | POA: Diagnosis not present

## 2019-09-01 DIAGNOSIS — K5790 Diverticulosis of intestine, part unspecified, without perforation or abscess without bleeding: Secondary | ICD-10-CM | POA: Diagnosis not present

## 2019-09-08 DIAGNOSIS — R590 Localized enlarged lymph nodes: Secondary | ICD-10-CM | POA: Diagnosis not present

## 2019-09-08 DIAGNOSIS — R911 Solitary pulmonary nodule: Secondary | ICD-10-CM | POA: Diagnosis not present

## 2019-09-08 DIAGNOSIS — E0590 Thyrotoxicosis, unspecified without thyrotoxic crisis or storm: Secondary | ICD-10-CM | POA: Diagnosis not present

## 2019-09-11 DIAGNOSIS — Z20828 Contact with and (suspected) exposure to other viral communicable diseases: Secondary | ICD-10-CM | POA: Diagnosis not present

## 2019-09-11 DIAGNOSIS — Z01812 Encounter for preprocedural laboratory examination: Secondary | ICD-10-CM | POA: Diagnosis not present

## 2019-09-11 DIAGNOSIS — R590 Localized enlarged lymph nodes: Secondary | ICD-10-CM | POA: Diagnosis not present

## 2019-09-15 DIAGNOSIS — R590 Localized enlarged lymph nodes: Secondary | ICD-10-CM | POA: Diagnosis not present

## 2019-09-15 DIAGNOSIS — Z825 Family history of asthma and other chronic lower respiratory diseases: Secondary | ICD-10-CM | POA: Diagnosis not present

## 2019-09-15 DIAGNOSIS — Z8701 Personal history of pneumonia (recurrent): Secondary | ICD-10-CM | POA: Diagnosis not present

## 2019-09-15 DIAGNOSIS — E0590 Thyrotoxicosis, unspecified without thyrotoxic crisis or storm: Secondary | ICD-10-CM | POA: Diagnosis not present

## 2019-09-15 DIAGNOSIS — Z6830 Body mass index (BMI) 30.0-30.9, adult: Secondary | ICD-10-CM | POA: Diagnosis not present

## 2019-09-15 DIAGNOSIS — K219 Gastro-esophageal reflux disease without esophagitis: Secondary | ICD-10-CM | POA: Diagnosis not present

## 2019-09-15 DIAGNOSIS — Z880 Allergy status to penicillin: Secondary | ICD-10-CM | POA: Diagnosis not present

## 2019-09-15 DIAGNOSIS — E669 Obesity, unspecified: Secondary | ICD-10-CM | POA: Diagnosis not present

## 2019-09-15 DIAGNOSIS — R918 Other nonspecific abnormal finding of lung field: Secondary | ICD-10-CM | POA: Diagnosis not present

## 2019-09-15 DIAGNOSIS — Z79899 Other long term (current) drug therapy: Secondary | ICD-10-CM | POA: Diagnosis not present

## 2019-10-05 DIAGNOSIS — R05 Cough: Secondary | ICD-10-CM | POA: Diagnosis not present

## 2019-10-05 DIAGNOSIS — D869 Sarcoidosis, unspecified: Secondary | ICD-10-CM | POA: Diagnosis not present

## 2019-10-05 DIAGNOSIS — J45909 Unspecified asthma, uncomplicated: Secondary | ICD-10-CM | POA: Diagnosis not present

## 2019-10-16 DIAGNOSIS — H25813 Combined forms of age-related cataract, bilateral: Secondary | ICD-10-CM | POA: Diagnosis not present

## 2019-10-16 DIAGNOSIS — H527 Unspecified disorder of refraction: Secondary | ICD-10-CM | POA: Diagnosis not present

## 2019-10-16 DIAGNOSIS — D869 Sarcoidosis, unspecified: Secondary | ICD-10-CM | POA: Diagnosis not present

## 2019-11-05 DIAGNOSIS — D869 Sarcoidosis, unspecified: Secondary | ICD-10-CM | POA: Diagnosis not present

## 2019-11-05 DIAGNOSIS — J849 Interstitial pulmonary disease, unspecified: Secondary | ICD-10-CM | POA: Diagnosis not present

## 2019-11-16 DIAGNOSIS — B078 Other viral warts: Secondary | ICD-10-CM | POA: Diagnosis not present

## 2019-11-16 DIAGNOSIS — R202 Paresthesia of skin: Secondary | ICD-10-CM | POA: Diagnosis not present

## 2019-11-16 DIAGNOSIS — L738 Other specified follicular disorders: Secondary | ICD-10-CM | POA: Diagnosis not present

## 2019-12-03 DIAGNOSIS — D869 Sarcoidosis, unspecified: Secondary | ICD-10-CM | POA: Diagnosis not present

## 2019-12-03 DIAGNOSIS — R590 Localized enlarged lymph nodes: Secondary | ICD-10-CM | POA: Diagnosis not present

## 2019-12-03 DIAGNOSIS — R05 Cough: Secondary | ICD-10-CM | POA: Diagnosis not present

## 2019-12-03 DIAGNOSIS — J45909 Unspecified asthma, uncomplicated: Secondary | ICD-10-CM | POA: Diagnosis not present

## 2019-12-29 DIAGNOSIS — R05 Cough: Secondary | ICD-10-CM | POA: Diagnosis not present
